# Patient Record
Sex: Female | Born: 1995 | Race: White | Hispanic: No | Marital: Single | Smoking: Never smoker
Health system: Southern US, Community
[De-identification: ages and names within clinical notes are randomized; demographics above are authoritative.]

---
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
## 2017-03-16 ENCOUNTER — Encounter (HOSPITAL_COMMUNITY): Payer: Self-pay | Admitting: Emergency Medicine

## 2017-03-16 ENCOUNTER — Ambulatory Visit (HOSPITAL_COMMUNITY)
Admission: EM | Admit: 2017-03-16 | Discharge: 2017-03-16 | Disposition: A | Discharge status: Home / Self Care | Payer: Self-pay | Attending: Internal Medicine | Admitting: Internal Medicine

## 2017-03-16 DIAGNOSIS — R05 Cough: Secondary | ICD-10-CM | POA: Insufficient documentation

## 2017-03-16 DIAGNOSIS — J069 Acute upper respiratory infection, unspecified: Secondary | ICD-10-CM

## 2017-03-16 DIAGNOSIS — R509 Fever, unspecified: Secondary | ICD-10-CM | POA: Insufficient documentation

## 2017-03-16 DIAGNOSIS — R51 Headache: Secondary | ICD-10-CM | POA: Insufficient documentation

## 2017-03-16 LAB — POCT RAPID STREP A: Streptococcus, Group A Screen (Direct): NEGATIVE

## 2017-03-16 MED ORDER — ONDANSETRON 4 MG PO TBDP: 4.0000 mg | ORAL_TABLET | Freq: Three times a day (TID) | ORAL | 0 refills | Status: AC | PRN

## 2017-03-16 MED ORDER — FLUTICASONE PROPIONATE 50 MCG/ACT NA SUSP: 2.0000 | Freq: Every day | NASAL | 0 refills | Status: AC

## 2017-03-16 NOTE — ED Provider Notes (Signed)
CSN: [id number]     Arrival date & time 03/16/17  1200 History   None    Chief Complaint  Patient presents with  . flu like symptoms   (Consider location/radiation/quality/duration/timing/severity/associated sxs/prior Treatment) 21 year old female comes in with mother for 2 day history of fever, bodyaches, fatigue. Patient started running a fever 2 days ago, Tmax 103.8. She has been given ibuprofen every 6-8 hours to control the fever. She has had some cough, and headaches. She has been feeling nauseous, can't keep food down. Mother has given her some Zofran, which has helped her keep fluids down. Denies ear pain, eye pain, sore throat. Denies chest pain, shortness of breath, wheezing. Denies abdominal pain, diarrhea. Denies urinary frequency, dysuria, hematuria. No sick contact. Recently started a new job, mother commented that patient has always gotten sick within a few days of starting a new job.      History reviewed. No pertinent past medical history. History reviewed. No pertinent surgical history. History reviewed. No pertinent family history. Social History  Substance Use Topics  . Smoking status: Never Smoker  . Smokeless tobacco: Never Used  . Alcohol use No   OB History    No data available     Review of Systems  Constitutional: Positive for appetite change, chills, diaphoresis, fatigue and fever.  HENT: Positive for rhinorrhea. Negative for congestion, ear discharge, ear pain, postnasal drip, sinus pain, sinus pressure, sore throat and trouble swallowing.   Eyes: Negative for pain, discharge, redness and itching.  Respiratory: Positive for cough. Negative for chest tightness, shortness of breath and wheezing.   Cardiovascular: Negative for chest pain and palpitations.  Gastrointestinal: Positive for nausea and vomiting. Negative for abdominal pain and diarrhea.  Genitourinary: Negative for difficulty urinating, dysuria, flank pain, frequency and hematuria.   Neurological: Positive for weakness and headaches. Negative for dizziness, tremors, syncope and light-headedness.    Allergies  Patient has no known allergies.  Home Medications   Prior to Admission medications   Medication Sig Start Date End Date Taking? Authorizing Provider  ibuprofen (ADVIL,MOTRIN) 400 MG tablet Take 400 mg by mouth every 6 (six) hours as needed.   Yes [provider]  fluticasone (FLONASE) 50 MCG/ACT nasal spray Place 2 sprays into both nostrils daily. 03/16/17   Cathie Hoops, Cris Talavera V, PA-C  ondansetron (ZOFRAN-ODT) 4 MG disintegrating tablet Take 1 tablet (4 mg total) by mouth every 8 (eight) hours as needed for nausea or vomiting. 03/16/17   Belinda Fisher, PA-C   Meds Ordered and Administered this Visit  Medications - No data to display  BP 106/75 (BP Location: Right Arm)   Pulse 97   Temp 100.1 F (37.8 C) (Oral)   LMP 02/16/2017 (Approximate)   SpO2 98%  No data found.   Physical Exam  Constitutional: She is oriented to person, place, and time. She appears well-developed and well-nourished.  Patient lethargic in appearance, but able to converse and speak in full sentences.   HENT:  Head: Normocephalic and atraumatic.  Right Ear: Tympanic membrane, external ear and ear canal normal. Tympanic membrane is not erythematous and not bulging.  Left Ear: Tympanic membrane, external ear and ear canal normal. Tympanic membrane is not erythematous and not bulging.  Nose: Nose normal. Right sinus exhibits no maxillary sinus tenderness and no frontal sinus tenderness. Left sinus exhibits no maxillary sinus tenderness and no frontal sinus tenderness.  Mouth/Throat: Uvula is midline and mucous membranes are normal. Posterior oropharyngeal erythema present. No oropharyngeal exudate or posterior  oropharyngeal edema. Tonsils are 1+ on the right. Tonsils are 1+ on the left. No tonsillar exudate.  Eyes: Pupils are equal, round, and reactive to light. Conjunctivae are normal.  Neck:  Normal range of motion. Neck supple.  Cardiovascular: Normal rate, regular rhythm and normal heart sounds.  Exam reveals no gallop and no friction rub.   No murmur heard. Pulmonary/Chest: Effort normal and breath sounds normal. She has no decreased breath sounds. She has no wheezes. She has no rhonchi. She has no rales.  Lymphadenopathy:    She has no cervical adenopathy.  Neurological: She is alert and oriented to person, place, and time.  Skin: Skin is warm and dry.  Psychiatric: She has a normal mood and affect. Her behavior is normal. Judgment normal.    Urgent Care Course     Procedures (including critical care time)  Labs Review Labs Reviewed  POCT RAPID STREP A    Imaging Review No results found.     MDM   1. Viral URI    Discussed lab results with mother and patient, rapid strep negative. Culture sent, patient will be contacted with any positive results. Additional treatment needed will be sent in then. Discussed with patient and mother symptoms most likely caused by viral illness. Start Flonase for nasal congestion. I have prescribed Zofran for nausea/vomiting. Push fluids. Tylenol/Motrin for fever and pain. To monitor for worsening of symptoms, shortness of breath, wheezing, to follow up here or her PCP for reevaluation.   Belinda FisherYu, Keyerra Lamere V, PA-C 03/16/17 1255

## 2017-03-16 NOTE — ED Triage Notes (Signed)
Pt developed a fever on Friday along with body aches and vomiting.  Pt is unable to eat anything although she states she is very hungry, but is able to keep water down.  Mother reports a fever of 103.8 last night.  Pt has been being treated with ibuprofen.

## 2017-03-16 NOTE — Discharge Instructions (Signed)
Your rapid strep was negative. Cultures are sent and you will be contacted with any positive results. Any additional treatment will be sent in then. Your symptoms are likely caused by a viral illness. Use Flonase for nasal congestion. I have prescribed use Zofran for nausea and vomiting. Keep well hydrated, your urine should be clear/pale yellow color. Continue Tylenol/Motrin for fever and pain. Continue to monitor for worsening of symptoms, shortness of breath, wheezing, to follow up here or PCP for further evaluation.

## 2017-03-18 LAB — CULTURE, GROUP A STREP (THRC)

## 2019-10-04 ENCOUNTER — Other Ambulatory Visit: Payer: Self-pay

## 2019-10-04 ENCOUNTER — Encounter (HOSPITAL_COMMUNITY): Payer: Self-pay

## 2019-10-04 ENCOUNTER — Ambulatory Visit (HOSPITAL_COMMUNITY)
Admission: EM | Admit: 2019-10-04 | Discharge: 2019-10-04 | Disposition: A | Discharge status: Home / Self Care | Payer: Medicaid Other | Attending: Family Medicine | Admitting: Family Medicine

## 2019-10-04 DIAGNOSIS — B354 Tinea corporis: Secondary | ICD-10-CM | POA: Diagnosis not present

## 2019-10-04 MED ORDER — CLOTRIMAZOLE 1 % EX CREA: TOPICAL_CREAM | CUTANEOUS | 0 refills | Status: AC

## 2019-10-04 NOTE — ED Triage Notes (Signed)
Patient presents to Urgent Care with complaints of rash on her left rib cage since last week. Patient reports she thinks it might be ringworm again, works at a Clinical biochemist place.

## 2019-10-04 NOTE — ED Provider Notes (Signed)
[facility]   [id number] 10/04/19 Arrival Time: 0846  ASSESSMENT & PLAN:  1. Tinea corporis    Begin: Meds ordered this encounter  Medications  . clotrimazole (LOTRIMIN) 1 % cream    Sig: Apply to affected area 2 times daily    Dispense:  28 g    Refill:  0    See AVS for d/c information.  Will follow up with PCP or here if worsening or failing to improve as anticipated. Reviewed expectations re: course of current medical issues. Questions answered. Outlined signs and symptoms indicating need for more acute intervention. Patient verbalized understanding. After Visit Summary given.   SUBJECTIVE:  Monica Kennedy is a 24 y.o. female who presents with a skin complaint.   Location: L side Onset: gradual Duration: noticed one week ago; slightly worse Associated pruritis? minimal Associated pain? none Progression: increasing steadily  Drainage? 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 No  Environmental exposures? No  Contacts with similar? No  Recent travel? No  Other associated symptoms: none Therapies tried thus far: none Arthralgia or myalgia? none Recent illness? none Fever? none New medications? none No specific aggravating or alleviating factors reported.    OBJECTIVE: Vitals:   10/04/19 0915  BP: 127/77  Pulse: 99  Resp: 16  Temp: 98.1 F (36.7 C)  TempSrc: Oral  SpO2: 100%    General appearance: alert; no distress HEENT: [place]; AT Neck: supple with FROM Lungs: clear to auscultation bilaterally Heart: regular rate and rhythm Extremities: no edema; moves all extremities normally Skin: warm and dry; L lateral chest wall near bra strap with approx 1.5 cm circular area of erythema; slightly raised borders with central clearing; no drainage; no fluctuance Psychological: alert and cooperative; normal mood and affect  No Known Allergies  History reviewed. No pertinent past medical  history. Social History   Socioeconomic History  . Marital status: Single    Spouse name: Not on file  . Number of children: Not on file  . Years of education: Not on file  . Highest education level: Not on file  Occupational History  . Not on file  Tobacco Use  . Smoking status: Never Smoker  . Smokeless tobacco: Never Used  Substance and Sexual Activity  . Alcohol use: Yes    Comment: socially  . Drug use: Yes    Types: Marijuana  . Sexual activity: Not on file  Other Topics Concern  . Not on file  Social History Narrative  . Not on file   Social Determinants of Health   Financial Resource Strain:   . Difficulty of Paying Living Expenses: Not on file  Food Insecurity:   . Worried About Programme researcher, broadcasting/film/video in the Last Year: Not on file  . Ran Out of Food in the Last Year: Not on file  Transportation Needs:   . Lack of Transportation (Medical): Not on file  . Lack of Transportation (Non-Medical): Not on file  Physical Activity:   . Days of Exercise per Week: Not on file  . Minutes of Exercise per Session: Not on file  Stress:   . Feeling of Stress : Not on file  Social Connections:   . Frequency of Communication with Friends and Family: Not on file  . Frequency of Social Gatherings with Friends and Family: Not on file  . Attends Religious Services: Not on file  . Active Member of Clubs or Organizations: Not on file  . Attends Banker  Meetings: Not on file  . Marital Status: Not on file  Intimate Partner Violence:   . Fear of Current or Ex-Partner: Not on file  . Emotionally Abused: Not on file  . Physically Abused: Not on file  . Sexually Abused: Not on file   Family History  Problem Relation Age of Onset  . Cancer Mother   . Healthy Father    History reviewed. No pertinent surgical history.   Vanessa Kick, MD 10/04/19 1054
